# Patient Record
Sex: Male | Born: 1975 | Race: White | Hispanic: No | Marital: Married | Smoking: Current every day smoker
Health system: Southern US, Community
[De-identification: ages and names within clinical notes are randomized; demographics above are authoritative.]

## PROBLEM LIST (undated history)

## (undated) HISTORY — PX: INNER EAR SURGERY: SHX679

---

## 2014-09-01 ENCOUNTER — Emergency Department (HOSPITAL_BASED_OUTPATIENT_CLINIC_OR_DEPARTMENT_OTHER): Payer: 59

## 2014-09-01 ENCOUNTER — Emergency Department (HOSPITAL_BASED_OUTPATIENT_CLINIC_OR_DEPARTMENT_OTHER)
Admission: EM | Admit: 2014-09-01 | Discharge: 2014-09-01 | Disposition: A | Discharge status: Home / Self Care | Payer: 59 | Attending: Emergency Medicine | Admitting: Emergency Medicine

## 2014-09-01 ENCOUNTER — Encounter (HOSPITAL_BASED_OUTPATIENT_CLINIC_OR_DEPARTMENT_OTHER): Payer: Self-pay | Admitting: Emergency Medicine

## 2014-09-01 DIAGNOSIS — Z720 Tobacco use: Secondary | ICD-10-CM | POA: Diagnosis not present

## 2014-09-01 DIAGNOSIS — J069 Acute upper respiratory infection, unspecified: Secondary | ICD-10-CM

## 2014-09-01 DIAGNOSIS — R05 Cough: Secondary | ICD-10-CM | POA: Diagnosis present

## 2014-09-01 MED ORDER — HYDROCOD POLST-CHLORPHEN POLST 10-8 MG/5ML PO LQCR
5.0000 mL | Freq: Two times a day (BID) | ORAL | Status: DC
Start: 2014-09-01 — End: 2016-05-28

## 2014-09-01 NOTE — ED Provider Notes (Signed)
CSN: [id number]     Arrival date & time 09/01/14  1320 History   First MD Initiated Contact with Patient 09/01/14 1409     Chief Complaint  Patient presents with  . Cough     (Consider location/radiation/quality/duration/timing/severity/associated sxs/prior Treatment) Patient is a 38 y.o. male presenting with cough. The history is provided by the patient. No language interpreter was used.  Cough Cough characteristics:  Productive Sputum characteristics:  Green and clear Severity:  Moderate Onset quality:  Gradual Duration:  1 week Timing:  Constant Chronicity:  New Smoker: yes   Context: sick contacts   Context comment:  Multiple family members. Associated symptoms: chest pain, headaches, myalgias, rhinorrhea and sore throat   Associated symptoms: no chills, no fever, no rash and no shortness of breath   Associated symptoms comment:  Chest congestion, cough, congestion and body aches for the past week. Initial sputum production was green, now clear. No night sweats or known fever.    History reviewed. No pertinent past medical history. Past Surgical History  Procedure Laterality Date  . Inner ear surgery     No family history on file. History  Substance Use Topics  . Smoking status: Current Every Day Smoker  . Smokeless tobacco: Not on file  . Alcohol Use: No    Review of Systems  Constitutional: Negative for fever and chills.  HENT: Positive for congestion, rhinorrhea and sore throat. Negative for trouble swallowing.   Respiratory: Positive for cough. Negative for shortness of breath.   Cardiovascular: Positive for chest pain.       Chest pain with cough.  Gastrointestinal: Negative.  Negative for nausea and vomiting.  Musculoskeletal: Positive for myalgias.  Skin: Negative.  Negative for rash.  Neurological: Positive for headaches.       Frontal headache      Allergies  Review of patient's allergies indicates no known allergies.  Home Medications   Prior  to Admission medications   Not on File   BP 132/79  Pulse 92  Temp(Src) 98.1 F (36.7 C) (Oral)  Resp 18  Ht 5\' 10"  (1.778 m)  Wt 285 lb (129.275 kg)  BMI 40.89 kg/m2  SpO2 94% Physical Exam  Constitutional: He is oriented to person, place, and time. He appears well-developed and well-nourished.  HENT:  Head: Normocephalic.  Nose: Mucosal edema present. Right sinus exhibits frontal sinus tenderness. Left sinus exhibits frontal sinus tenderness.  Mouth/Throat: Uvula is midline. Mucous membranes are not dry. Posterior oropharyngeal edema and posterior oropharyngeal erythema present. No oropharyngeal exudate or tonsillar abscesses.  Neck: Normal range of motion. Neck supple.  Cardiovascular: Normal rate and normal heart sounds.   No murmur heard. Pulmonary/Chest: Effort normal and breath sounds normal. He has no wheezes. He has no rales.  Abdominal: Soft. Bowel sounds are normal. He exhibits no distension. There is no tenderness.  Musculoskeletal: Normal range of motion.  Lymphadenopathy:    He has no cervical adenopathy.  Neurological: He is alert and oriented to person, place, and time. Coordination normal.  Skin: Skin is warm and dry. No pallor.    ED Course  Procedures (including critical care time) Labs Review Labs Reviewed - No data to display  Imaging Review Dg Chest 2 View  09/01/2014   CLINICAL DATA:  Cough and congestion for a few days.  EXAM: CHEST  2 VIEW  COMPARISON:  None.  FINDINGS: The cardiomediastinal silhouette is within normal limits. The lungs are well inflated and clear. There is no evidence of  pleural effusion or pneumothorax. No acute osseous abnormality is identified.  IMPRESSION: No active cardiopulmonary disease.   Electronically Signed   By: Sebastian AcheAllen  Grady   On: 09/01/2014 14:17     EKG Interpretation None      MDM   Final diagnoses:  None    1. Viral URI  Given multiple family members with similar symptoms and no fever, suspect viral  process. Encouraged supportive management, return if worse.     Arnoldo HookerShari A Jessicamarie Amiri, PA-C 09/01/14 1432

## 2014-09-01 NOTE — Discharge Instructions (Signed)
Upper Respiratory Infection, Adult An upper respiratory infection (URI) is also known as the common cold. It is often caused by a type of germ (virus). Colds are easily spread (contagious). You can pass it to others by kissing, coughing, sneezing, or drinking out of the same glass. Usually, you get better in 1 or 2 weeks.  HOME CARE   Only take medicine as told by your doctor.  Use a warm mist humidifier or breathe in steam from a hot shower.  Drink enough water and fluids to keep your pee (urine) clear or pale yellow.  Get plenty of rest.  Return to work when your temperature is back to normal or as told by your doctor. You may use a face mask and wash your hands to stop your cold from spreading. GET HELP RIGHT AWAY IF:   After the first few days, you feel you are getting worse.  You have questions about your medicine.  You have chills, shortness of breath, or brown or red spit (mucus).  You have yellow or brown snot (nasal discharge) or pain in the face, especially when you bend forward.  You have a fever, puffy (swollen) neck, pain when you swallow, or white spots in the back of your throat.  You have a bad headache, ear pain, sinus pain, or chest pain.  You have a high-pitched whistling sound when you breathe in and out (wheezing).  You have a lasting cough or cough up blood.  You have sore muscles or a stiff neck. MAKE SURE YOU:   Understand these instructions.  Will watch your condition.  Will get help right away if you are not doing well or get worse. Document Released: 04/21/2008 Document Revised: 01/26/2012 Document Reviewed: 02/08/2014 [employer] Patient Information 2015 [employer]. This information is not intended to replace advice given to you by your health care provider. Make sure you discuss any questions you have with your health care provider.  

## 2014-09-01 NOTE — ED Notes (Signed)
Pt c/o cough, HA and body aches x1 week. Pt denies N/V/D.

## 2014-09-01 NOTE — ED Provider Notes (Signed)
Medical screening examination/treatment/procedure(s) were performed by non-physician practitioner and as supervising physician I was immediately available for consultation/collaboration.   EKG Interpretation None       Ethelda ChickMartha K Linker, MD 09/01/14 1433

## 2015-03-13 IMAGING — CR DG CHEST 2V
2 series · 2 of 2 positions shown · non-contrast
Comparison: None.

CLINICAL DATA: Cough and congestion for a few days.

EXAM:
CHEST  2 VIEW

[w chest pa]
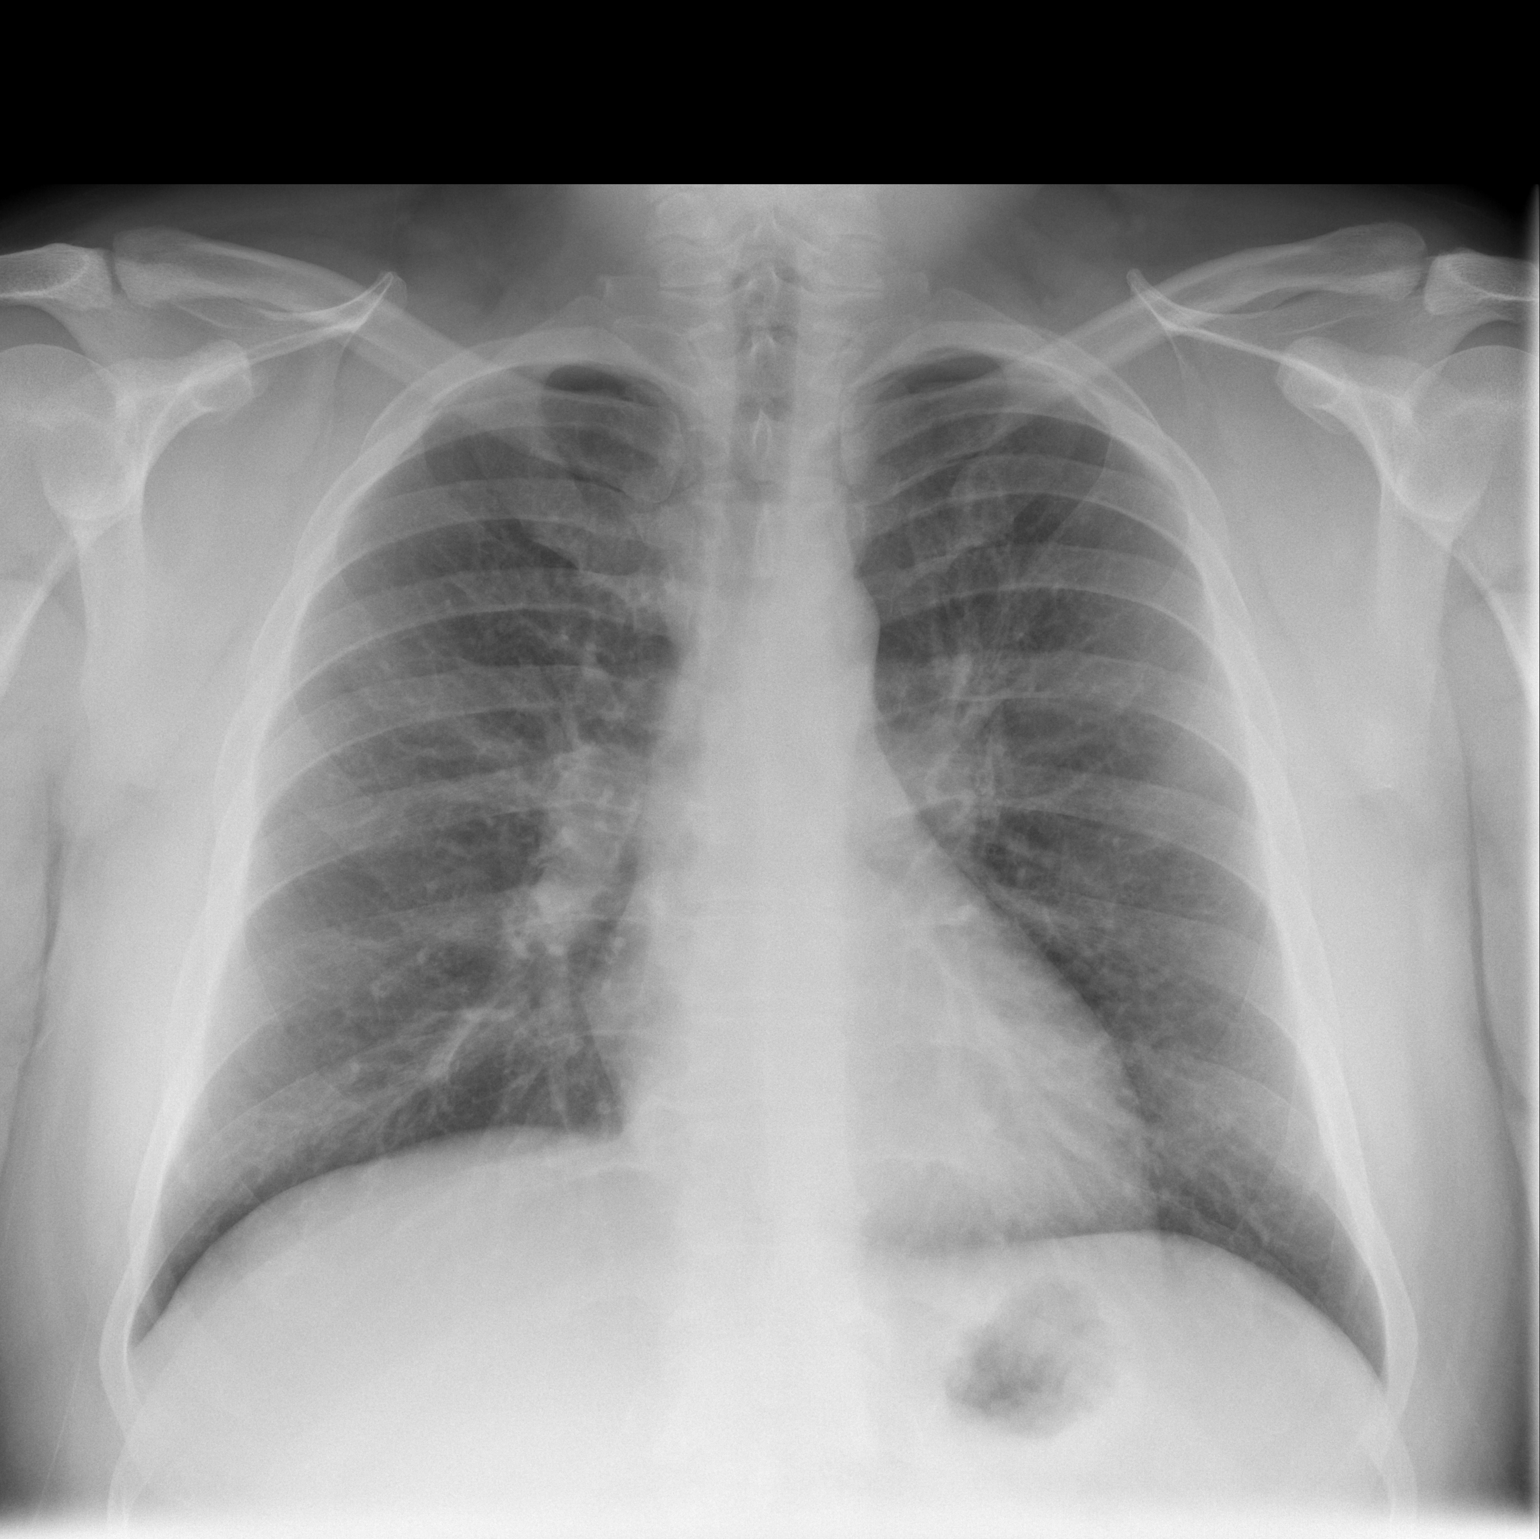

[w chest lat]
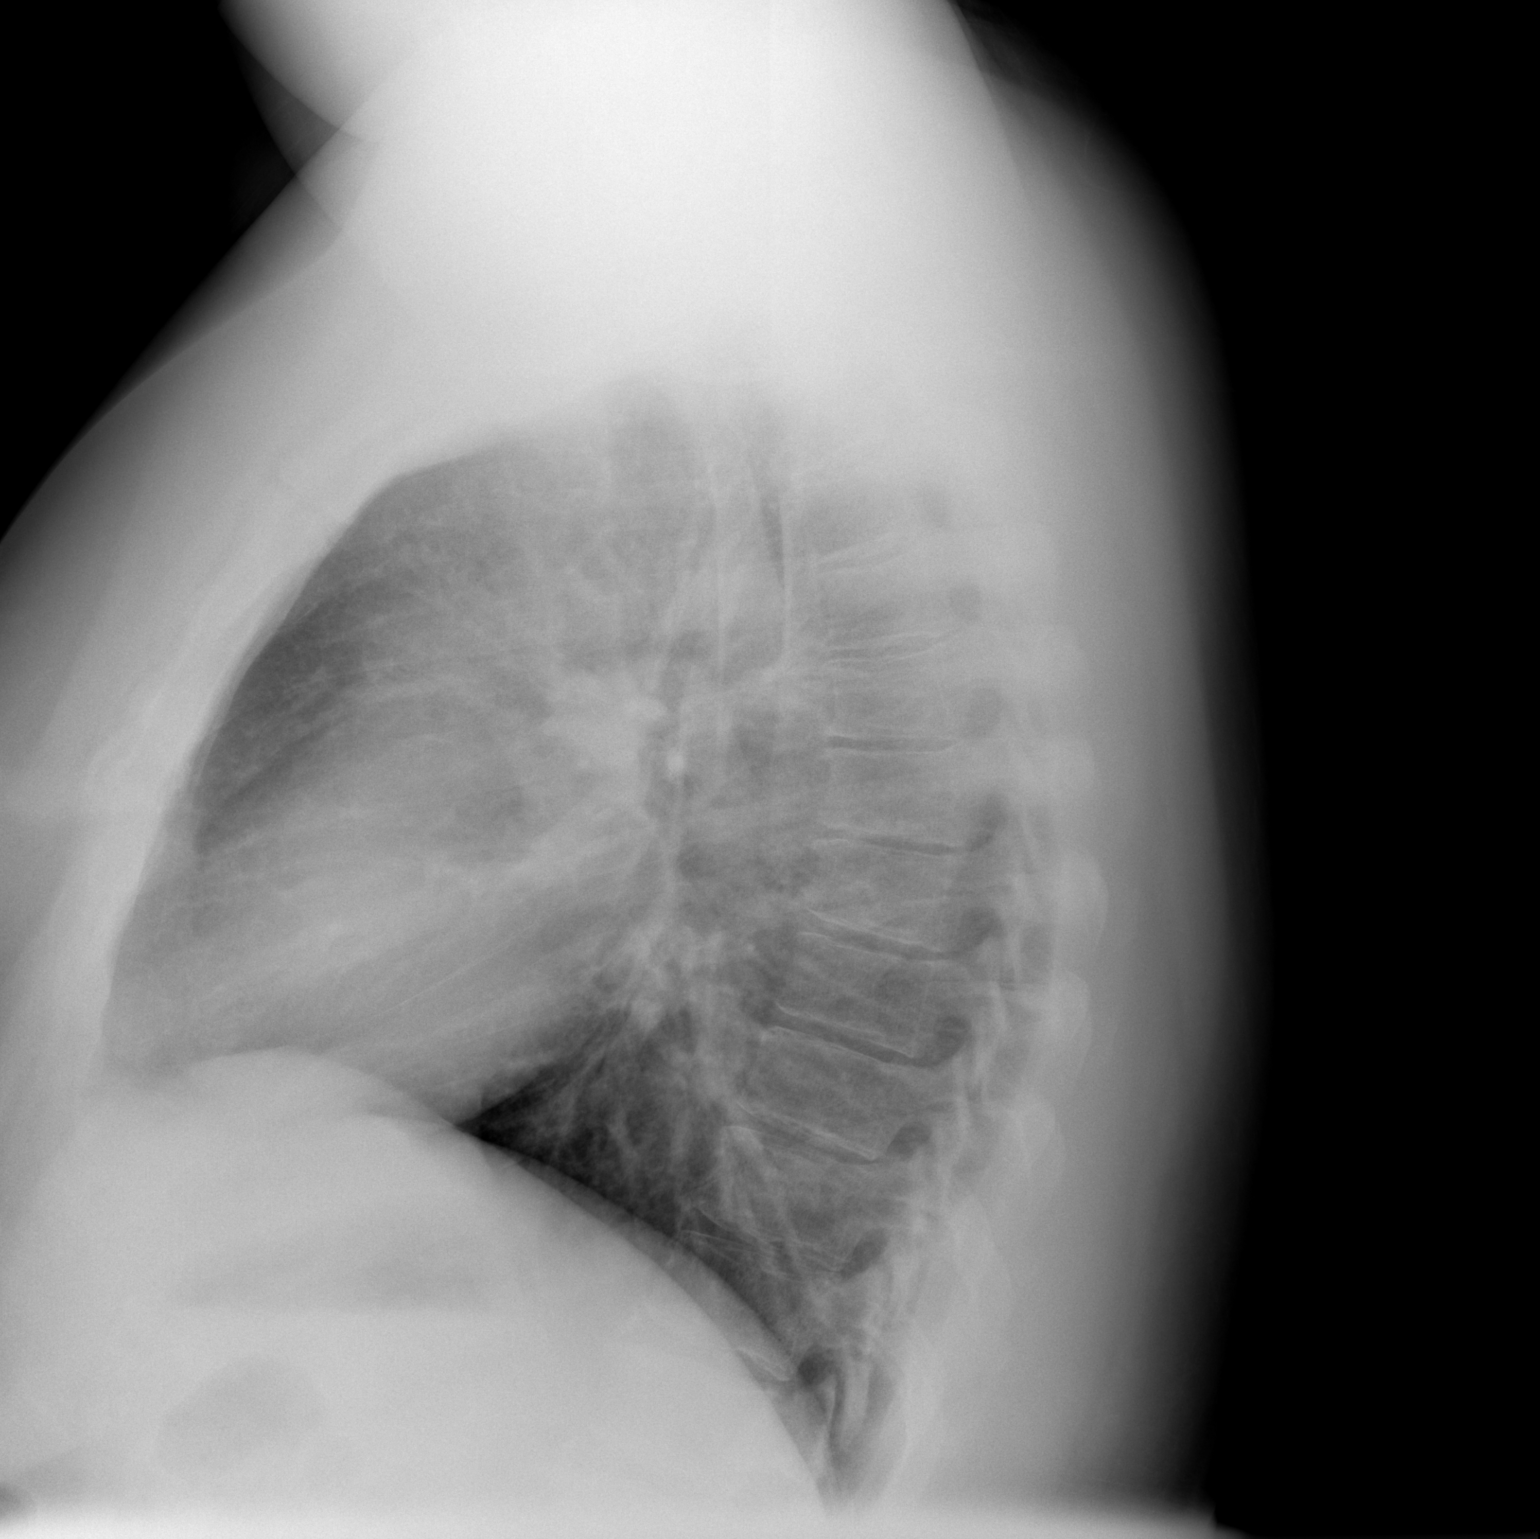

[2 of 2 positions shown; findings below may reference images not displayed]

FINDINGS: The cardiomediastinal silhouette is within normal limits. The lungs
are well inflated and clear. There is no evidence of pleural
effusion or pneumothorax. No acute osseous abnormality is
identified.
IMPRESSION: No active cardiopulmonary disease.

## 2016-05-28 ENCOUNTER — Encounter (HOSPITAL_BASED_OUTPATIENT_CLINIC_OR_DEPARTMENT_OTHER): Payer: Self-pay | Admitting: *Deleted

## 2016-05-28 ENCOUNTER — Emergency Department (HOSPITAL_BASED_OUTPATIENT_CLINIC_OR_DEPARTMENT_OTHER)
Admission: EM | Admit: 2016-05-28 | Discharge: 2016-05-28 | Disposition: A | Discharge status: Home / Self Care | Payer: 59 | Attending: Emergency Medicine | Admitting: Emergency Medicine

## 2016-05-28 ENCOUNTER — Emergency Department (HOSPITAL_BASED_OUTPATIENT_CLINIC_OR_DEPARTMENT_OTHER): Payer: 59

## 2016-05-28 DIAGNOSIS — Y990 Civilian activity done for income or pay: Secondary | ICD-10-CM | POA: Diagnosis not present

## 2016-05-28 DIAGNOSIS — F17200 Nicotine dependence, unspecified, uncomplicated: Secondary | ICD-10-CM | POA: Insufficient documentation

## 2016-05-28 DIAGNOSIS — Y929 Unspecified place or not applicable: Secondary | ICD-10-CM | POA: Insufficient documentation

## 2016-05-28 DIAGNOSIS — W2201XA Walked into wall, initial encounter: Secondary | ICD-10-CM | POA: Insufficient documentation

## 2016-05-28 DIAGNOSIS — S62306A Unspecified fracture of fifth metacarpal bone, right hand, initial encounter for closed fracture: Secondary | ICD-10-CM

## 2016-05-28 DIAGNOSIS — S62316A Displaced fracture of base of fifth metacarpal bone, right hand, initial encounter for closed fracture: Secondary | ICD-10-CM | POA: Insufficient documentation

## 2016-05-28 DIAGNOSIS — S6991XA Unspecified injury of right wrist, hand and finger(s), initial encounter: Secondary | ICD-10-CM | POA: Diagnosis present

## 2016-05-28 DIAGNOSIS — Y9389 Activity, other specified: Secondary | ICD-10-CM | POA: Diagnosis not present

## 2016-05-28 MED ORDER — TETANUS-DIPHTH-ACELL PERTUSSIS 5-2.5-18.5 LF-MCG/0.5 IM SUSP
0.5000 mL | Freq: Once | INTRAMUSCULAR | Status: AC
  Administered 2016-05-28: 0.5 mL via INTRAMUSCULAR

## 2016-05-28 MED ORDER — NAPROXEN 500 MG PO TABS: 500.0000 mg | ORAL_TABLET | Freq: Two times a day (BID) | ORAL | Status: AC

## 2016-05-28 MED ORDER — TETANUS-DIPHTH-ACELL PERTUSSIS 5-2.5-18.5 LF-MCG/0.5 IM SUSP
0.5000 mL | Freq: Once | INTRAMUSCULAR | Status: DC
  Filled 2016-05-28: qty 0.5

## 2016-05-28 NOTE — ED Notes (Signed)
Pt was ok with splint being applied.

## 2016-05-28 NOTE — Discharge Instructions (Signed)
Metacarpal Fracture °A metacarpal fracture is a break (fracture) of a bone in the hand. Metacarpals are the bones that extend from your knuckles to your wrist. In each hand, you have five metacarpal bones that connect your fingers and your thumb to your wrist. °Some hand fractures have bone pieces that are close together and stable (simple). These fractures may be treated with only a splint or cast. Hand fractures that have many pieces of broken bone (comminuted), unstable bone pieces (displaced), or a bone that breaks through the skin (compound) usually require surgery. °CAUSES °This injury may be caused by: °· A fall. °· A hard, direct hit to your hand. °· An injury that squeezes your knuckle, stretches your finger out of place, or crushes your hand. °RISK FACTORS °This injury is more likely to occur if: °· You play contact sports. °· You have certain bone diseases. °SYMPTOMS  °Symptoms of this type of fracture develop soon after the injury. Symptoms may include: °· Swelling. °· Pain. °· Stiffness. °· Increased pain with movement. °· Bruising. °· Inability to move a finger. °· A shortened finger. °· A finger knuckle that looks sunken in. °· Unusual appearance of the hand or finger (deformity). °DIAGNOSIS  °This injury may be diagnosed based on your signs and symptoms, especially if you had a recent hand injury. Your health care provider will perform a physical exam. He or she may also order X-rays to confirm the diagnosis.  °TREATMENT  °Treatment for this injury depends on the type of fracture you have and how severe it is. Possible treatments include: °· Non-reduction. This can be done if the bone does not need to be moved back into place. The fracture can be casted or splinted as it is.   °· Closed reduction. If your bone is stable and can be moved back into place, you may only need to wear a cast or splint or have buddy taping. °· Closed reduction with internal fixation (CRIF). This is the most common  treatment. You may have this procedure if your bone can be moved back into place but needs more support. Wires, pins, or screws may be inserted through your skin to stabilize the fracture. °· Open reduction with internal fixation (ORIF). This may be needed if your fracture is severe and unstable. It involves surgery to move your bone back into the right position. Screws, wires, or plates are used to stabilize the fracture. °After all procedures, you may need to wear a cast or a splint for several weeks. You will also need to have follow-up X-rays to make sure that the bone is healing well and staying in position. After you no longer need your cast or splint, you may need physical therapy. This will help you to regain full movement and strength in your hand.  °HOME CARE INSTRUCTIONS  °If You Have a Cast: °· Do not stick anything inside the cast to scratch your skin. Doing that increases your risk of infection. °· Check the skin around the cast every day. Report any concerns to your health care provider. You may put lotion on dry skin around the edges of the cast. Do not apply lotion to the skin underneath the cast. °If You Have a Splint: °· Wear it as directed by your health care provider. Remove it only as directed by your health care provider. °· Loosen the splint if your fingers become numb and tingle, or if they turn cold and blue. °Bathing °· Cover the cast or splint with a   watertight plastic bag to protect it from water while you take a bath or a shower. Do not let the cast or splint get wet. °Managing Pain, Stiffness, and Swelling °· If directed, apply ice to the injured area (if you have a splint, not a cast): °¨ Put ice in a plastic bag. °¨ Place a towel between your skin and the bag. °¨ Leave the ice on for 20 minutes, 2-3 times a day. °· Move your fingers often to avoid stiffness and to lessen swelling. °· Raise the injured area above the level of your heart while you are sitting or lying  down. °Driving °· Do not drive or operate heavy machinery while taking pain medicine. °· Do not drive while wearing a cast or splint on a hand that you use for driving. °Activity °· Return to your normal activities as directed by your health care provider. Ask your health care provider what activities are safe for you. °General Instructions °· Do not put pressure on any part of the cast or splint until it is fully hardened. This may take several hours. °· Keep the cast or splint clean and dry. °· Do not use any tobacco products, including cigarettes, chewing tobacco, or electronic cigarettes. Tobacco can delay bone healing. If you need help quitting, ask your health care provider. °· Take medicines only as directed by your health care provider. °· Keep all follow-up visits as directed by your health care provider. This is important. °SEEK MEDICAL CARE IF:  °· Your pain is getting worse. °· You have redness, swelling, or pain in the injured area.   °· You have fluid, blood, or pus coming from under your cast or splint.   °· You notice a bad smell coming from under your cast or splint.   °· You have a fever.   °SEEK IMMEDIATE MEDICAL CARE IF:  °· You develop a rash.   °· You have trouble breathing.   °· Your skin or nails on your injured hand turn blue or gray even after you loosen your splint. °· Your injured hand feels cold or becomes numb even after you loosen your splint.   °· You develop severe pain under the cast or in your hand. °  °This information is not intended to replace advice given to you by your health care provider. Make sure you discuss any questions you have with your health care provider. °  °Document Released: 11/03/2005 Document Revised: 07/25/2015 Document Reviewed: 08/23/2014 °Elsevier Interactive Patient Education ©2016 [employer] ° °

## 2016-05-28 NOTE — ED Provider Notes (Addendum)
CSN: [id number]     Arrival date & time 05/28/16  0701 History   First MD Initiated Contact with Patient 05/28/16 [phone]     Chief Complaint  Patient presents with  . Hand Injury   HPI Comments: Pt punched a wall last night.  He has pain now primarily in the outer aspect of his hand.  He noticed swelling and feels like the bones are out of place.  Patient is a 40 y.o. male presenting with hand injury. The history is provided by the patient.  Hand Injury Location:  Hand Hand location:  R hand Pain details:    Quality:  Aching and dull   Radiates to:  Does not radiate   Severity:  Moderate   Duration:  1 day   Timing:  Constant Chronicity:  New Handedness:  Right-handed Foreign body present:  No foreign bodies Tetanus status:  Out of date Relieved by:  Nothing Worsened by:  Movement Associated symptoms: stiffness and swelling   Associated symptoms: no fever     History reviewed. No pertinent past medical history. Past Surgical History  Procedure Laterality Date  . Inner ear surgery     No family history on file. Social History  Substance Use Topics  . Smoking status: Current Every Day Smoker  . Smokeless tobacco: None  . Alcohol Use: No    Review of Systems  Constitutional: Negative for fever.  Musculoskeletal: Positive for stiffness.  All other systems reviewed and are negative.     Allergies  Review of patient's allergies indicates no known allergies.  Home Medications   Prior to Admission medications   Medication Sig Start Date End Date Taking? Authorizing Provider  naproxen (NAPROSYN) 500 MG tablet Take 1 tablet (500 mg total) by mouth 2 (two) times daily with a meal. As needed for pain 05/28/16   Linwood DibblesJon Brenner Visconti, MD   BP 140/88 mmHg  Pulse 91  Temp(Src) 98.9 F (37.2 C) (Oral)  Resp 18  Ht 5\' 11"  (1.803 m)  Wt 133.811 kg  BMI 41.16 kg/m2  SpO2 97% Physical Exam  Constitutional: He appears well-developed and well-nourished. No distress.  HENT:  Head:  Normocephalic and atraumatic.  Right Ear: External ear normal.  Left Ear: External ear normal.  Eyes: Conjunctivae are normal. Right eye exhibits no discharge. Left eye exhibits no discharge. No scleral icterus.  Neck: Neck supple. No tracheal deviation present.  Cardiovascular: Normal rate.   Pulmonary/Chest: Effort normal. No stridor. No respiratory distress.  Musculoskeletal: He exhibits tenderness. He exhibits no edema.       Right hand: He exhibits tenderness and bony tenderness. He exhibits normal capillary refill. Normal sensation noted. Normal strength noted.       Hands: Neurological: He is alert. Cranial nerve deficit: no gross deficits.  Skin: Skin is warm and dry. No rash noted.  Psychiatric: He has a normal mood and affect.  Nursing note and vitals reviewed.   ED Course  Procedures   Ulnar gutter splint applied by ED team  Imaging Review Dg Hand Complete Right  05/28/2016  CLINICAL DATA:  Punched a wall last night.  Pain and swelling EXAM: RIGHT HAND - COMPLETE 3+ VIEW COMPARISON:  None. FINDINGS: Fracture of the distal fifth metacarpal with mild angulation and mild displacement. No other fracture or arthropathy. Wrist joint appears normal. IMPRESSION: Mildly displaced and mildly angulated fracture distal fifth metacarpal. Electronically Signed   By: Marlan Palauharles  Clark M.D.   On: 05/28/2016 07:27   I have personally  reviewed and evaluated these images and lab results as part of my medical decision-making.   MDM   Final diagnoses:  Fracture of fifth metacarpal bone of right hand, closed, initial encounter   Mild angulation on xray.  No open wounds.  Splinted, ulnar gutter.  Follow up with orthopedic hand.   Linwood Dibbles, MD 05/28/16 [phone]  Pt states that he does not want a splint.  He says that it will cover up his tattoo and discolor it and then he stated he wont be able to work with a cast.  We went over there purposes of the splint and why it was important to imobilize  the finger so the bone can heal properly.  He asked for a Velcro wrist splint. I explained to him that that type of splint won't immobilize the finger and it won't work.  He then asked for keeping his fingers together with a tongue blade.  I looked up online prefab splints that we dont have in the ED.  I showed him a picture of one that he can get online.    [id number]  Pt now asks for the splint to be put on.  Linwood Dibbles, MD 05/28/16 [phone]

## 2016-05-28 NOTE — ED Notes (Signed)
Pt was explained how the fiberglass splint would be applied and that the length that he wanted was not appropriate. I asked EMT John, whom has more experience in splints and he also stated that from the fourth or fifth finger and stopping approx. two to three inches is not appropriate for this splint. Pt stated he did not want the splint to discolor his tattoo and did not was to be want to be placed on light duty due to this splint. MD Lynelle DoctorKnapp was informed of what Pt stated.

## 2016-05-28 NOTE — ED Notes (Signed)
Pt punched wall last pm.  Pain and swelling to right hand.

## 2016-05-28 NOTE — ED Notes (Signed)
Pt has returned from radiology.  

## 2016-12-07 IMAGING — CR DG HAND COMPLETE 3+V*R*
3 series · 3 of 3 positions shown · non-contrast
Comparison: None.

CLINICAL DATA: Punched a wall last night.  Pain and swelling

EXAM:
RIGHT HAND - COMPLETE 3+ VIEW

[x hand pa right]
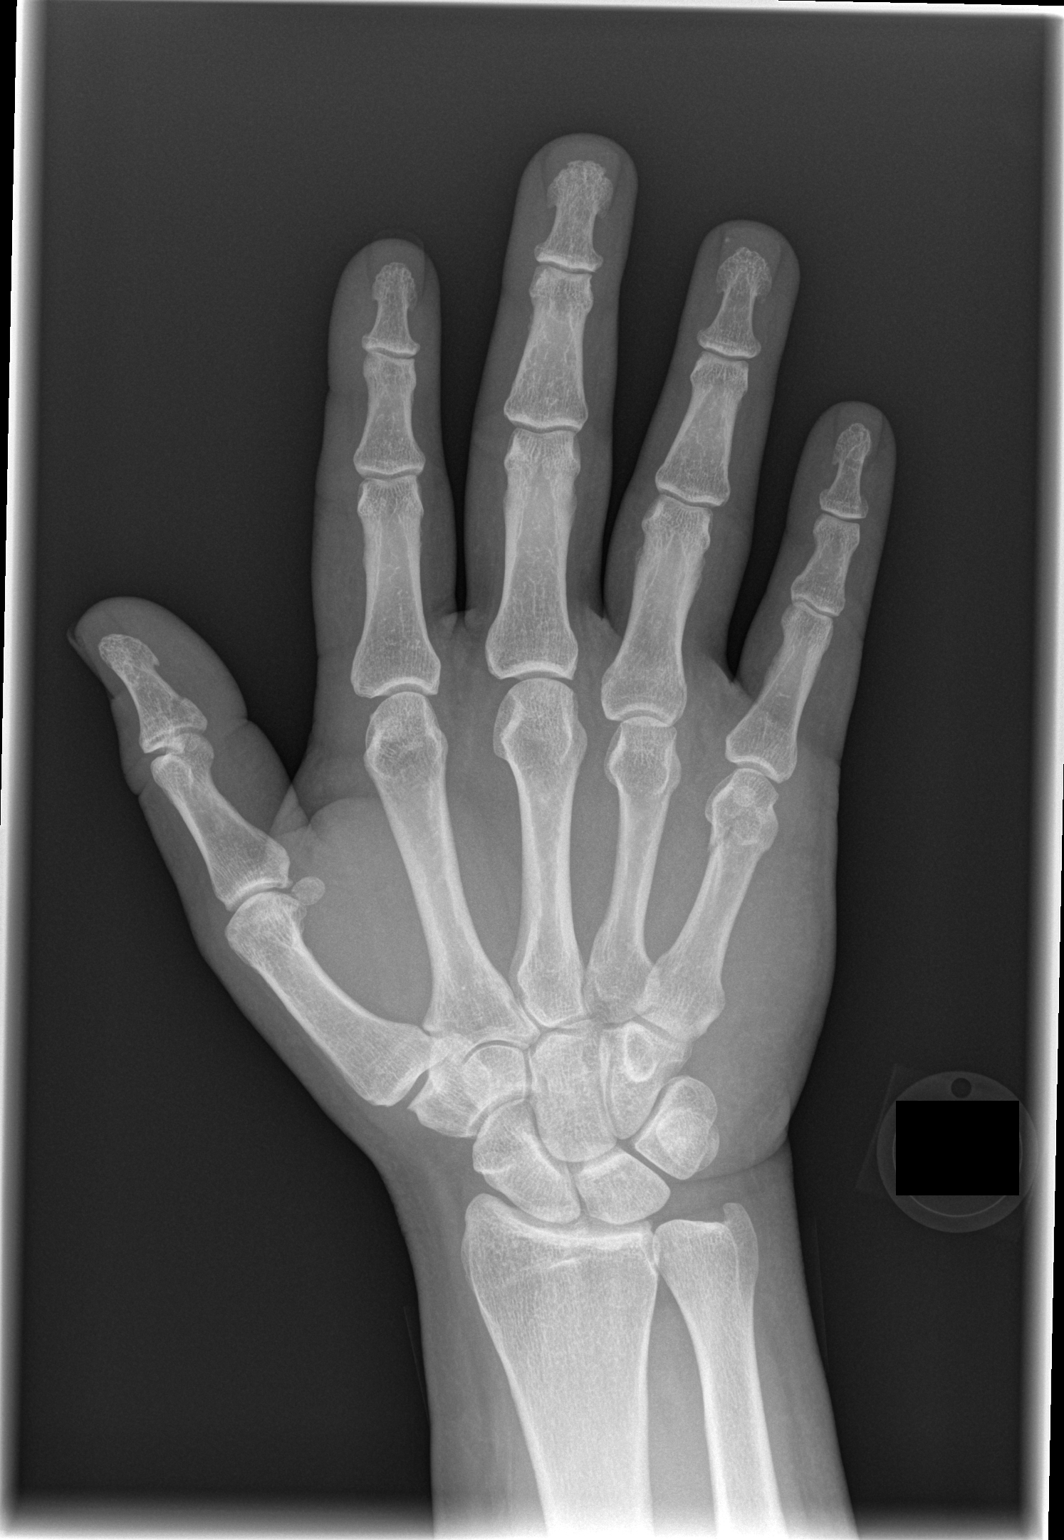

[x hand oblique right]
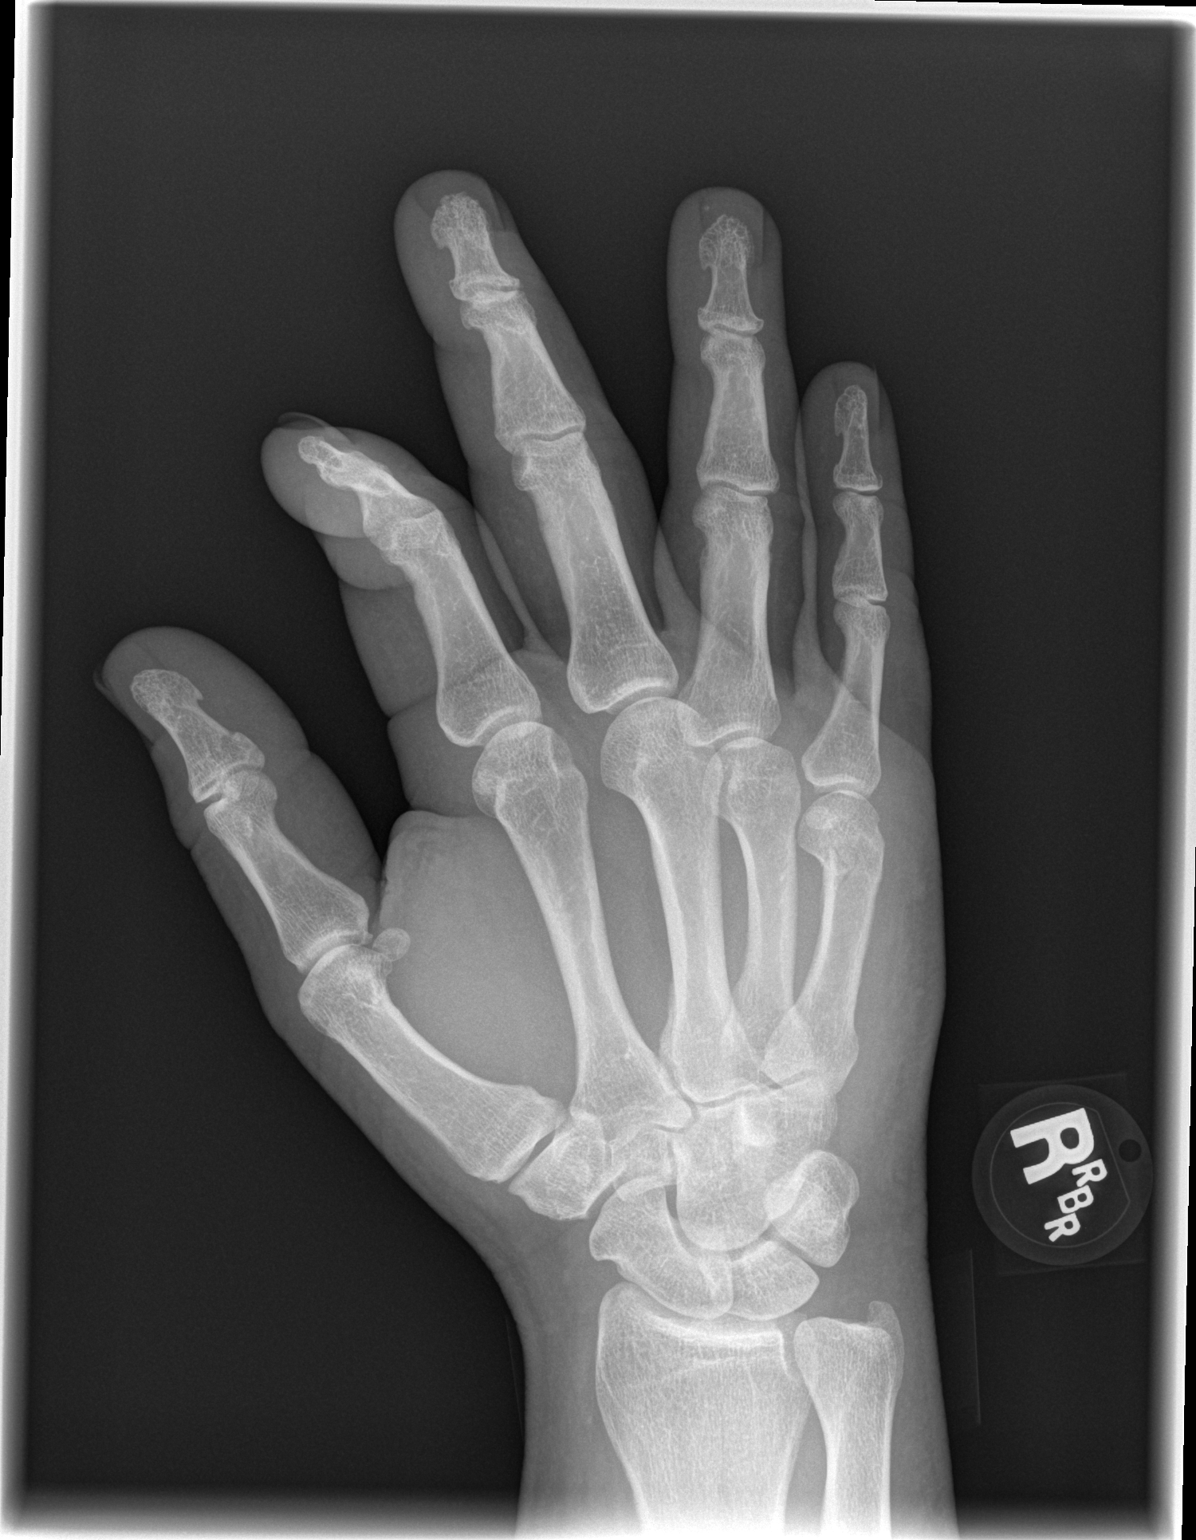

[x hand lat right *]
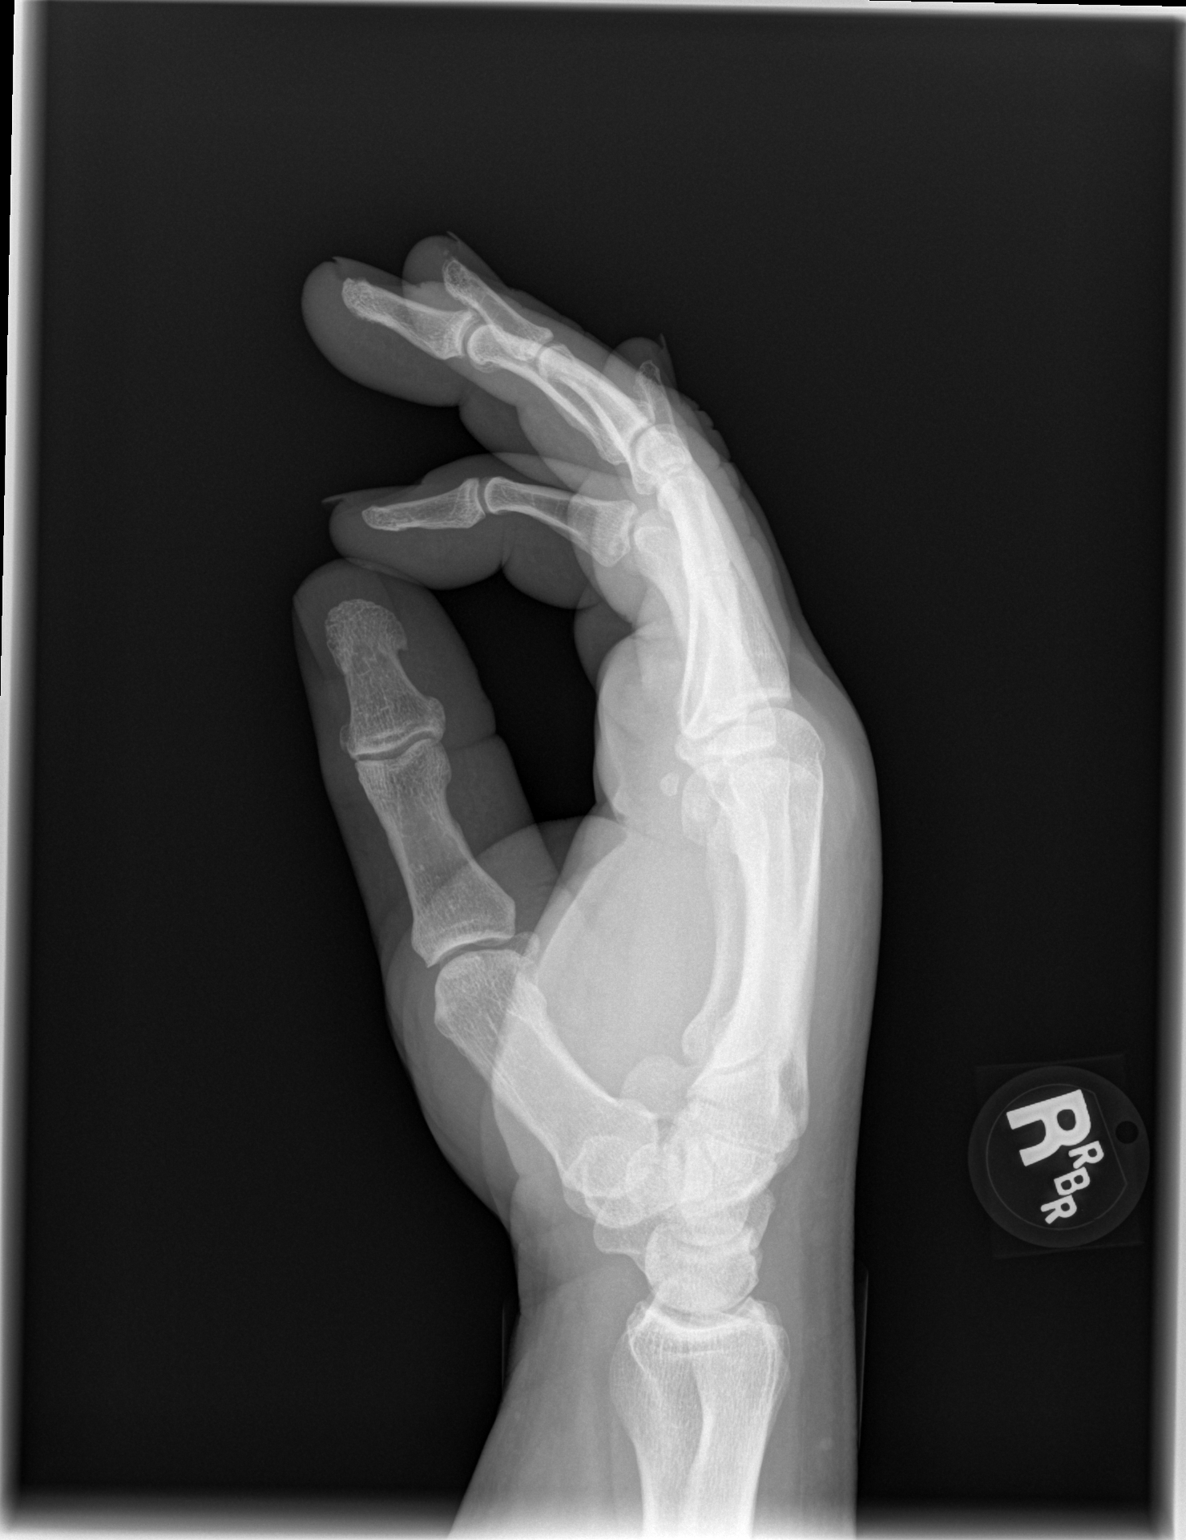

[3 of 3 positions shown; findings below may reference images not displayed]

FINDINGS: Fracture of the distal fifth metacarpal with mild angulation and
mild displacement. No other fracture or arthropathy. Wrist joint
appears normal.
IMPRESSION: Mildly displaced and mildly angulated fracture distal fifth
metacarpal.
